# Patient Record
Sex: Male | Born: 1980 | Race: Black or African American | Hispanic: No | Marital: Married | Smoking: Never smoker
Health system: Southern US, Community
[De-identification: ages and names within clinical notes are randomized; demographics above are authoritative.]

---

## 2000-06-24 ENCOUNTER — Encounter: Payer: Self-pay | Admitting: Emergency Medicine

## 2000-06-24 ENCOUNTER — Emergency Department (HOSPITAL_COMMUNITY): Admission: EM | Admit: 2000-06-24 | Discharge: 2000-06-24 | Payer: Self-pay | Admitting: Emergency Medicine

## 2012-03-19 ENCOUNTER — Emergency Department (HOSPITAL_BASED_OUTPATIENT_CLINIC_OR_DEPARTMENT_OTHER)
Admission: EM | Admit: 2012-03-19 | Discharge: 2012-03-19 | Disposition: A | Discharge status: Home / Self Care | Payer: Managed Care, Other (non HMO) | Attending: Emergency Medicine | Admitting: Emergency Medicine

## 2012-03-19 ENCOUNTER — Encounter (HOSPITAL_BASED_OUTPATIENT_CLINIC_OR_DEPARTMENT_OTHER): Payer: Self-pay | Admitting: *Deleted

## 2012-03-19 DIAGNOSIS — Y92410 Unspecified street and highway as the place of occurrence of the external cause: Secondary | ICD-10-CM | POA: Insufficient documentation

## 2012-03-19 DIAGNOSIS — IMO0002 Reserved for concepts with insufficient information to code with codable children: Secondary | ICD-10-CM | POA: Insufficient documentation

## 2012-03-19 DIAGNOSIS — Y9389 Activity, other specified: Secondary | ICD-10-CM | POA: Insufficient documentation

## 2012-03-19 DIAGNOSIS — S40019A Contusion of unspecified shoulder, initial encounter: Secondary | ICD-10-CM | POA: Insufficient documentation
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
## 2013-01-03 ENCOUNTER — Emergency Department (HOSPITAL_BASED_OUTPATIENT_CLINIC_OR_DEPARTMENT_OTHER): Payer: Managed Care, Other (non HMO)

## 2013-01-03 ENCOUNTER — Emergency Department (HOSPITAL_BASED_OUTPATIENT_CLINIC_OR_DEPARTMENT_OTHER)
Admission: EM | Admit: 2013-01-03 | Discharge: 2013-01-03 | Disposition: A | Discharge status: Home / Self Care | Payer: Managed Care, Other (non HMO) | Attending: Emergency Medicine | Admitting: Emergency Medicine

## 2013-01-03 ENCOUNTER — Encounter (HOSPITAL_BASED_OUTPATIENT_CLINIC_OR_DEPARTMENT_OTHER): Payer: Self-pay | Admitting: Emergency Medicine

## 2013-01-03 DIAGNOSIS — J069 Acute upper respiratory infection, unspecified: Secondary | ICD-10-CM | POA: Insufficient documentation

## 2013-01-03 LAB — BASIC METABOLIC PANEL
CO2: 22 mEq/L (ref 19–32)
Calcium: 9.3 mg/dL (ref 8.4–10.5)
Creatinine, Ser: 1.2 mg/dL (ref 0.50–1.35)
GFR calc non Af Amer: 78 mL/min — ABNORMAL LOW (ref >90)
Glucose, Bld: 101 mg/dL — ABNORMAL HIGH (ref 70–99)
Sodium: 139 mEq/L (ref 135–145)

## 2013-01-03 LAB — CBC WITH DIFFERENTIAL/PLATELET
Basophils Absolute: 0 10*3/uL (ref 0.0–0.1)
Basophils Relative: 0 % (ref 0–1)
Eosinophils Absolute: 0.1 10*3/uL (ref 0.0–0.7)
Eosinophils Relative: 1 % (ref 0–5)
HCT: 42.5 % (ref 39.0–52.0)
Hemoglobin: 13.9 g/dL (ref 13.0–17.0)
Lymphocytes Relative: 8 % — ABNORMAL LOW (ref 12–46)
Lymphs Abs: 0.6 10*3/uL — ABNORMAL LOW (ref 0.7–4.0)
MCH: 25.7 pg — ABNORMAL LOW (ref 26.0–34.0)
MCHC: 32.7 g/dL (ref 30.0–36.0)
MCV: 78.6 fL (ref 78.0–100.0)
Monocytes Absolute: 1 10*3/uL (ref 0.1–1.0)
Monocytes Relative: 12 % (ref 3–12)
Neutro Abs: 6.6 10*3/uL (ref 1.7–7.7)
Neutrophils Relative %: 80 % — ABNORMAL HIGH (ref 43–77)
Platelets: 144 10*3/uL — ABNORMAL LOW (ref 150–400)
RBC: 5.41 MIL/uL (ref 4.22–5.81)
RDW: 14 % (ref 11.5–15.5)
WBC: 8.3 10*3/uL (ref 4.0–10.5)

## 2013-01-03 LAB — CG4 I-STAT (LACTIC ACID): Lactic Acid, Venous: 0.89 mmol/L (ref 0.5–2.2)

## 2013-01-03 MED ORDER — KETOROLAC TROMETHAMINE 30 MG/ML IJ SOLN
30.0000 mg | Freq: Once | INTRAMUSCULAR | Status: AC
  Administered 2013-01-03: 30 mg via INTRAVENOUS
  Filled 2013-01-03: qty 1

## 2013-01-03 MED ORDER — SODIUM CHLORIDE 0.9 % IV BOLUS (SEPSIS)
1000.0000 mL | Freq: Once | INTRAVENOUS | Status: AC
  Administered 2013-01-03: 1000 mL via INTRAVENOUS

## 2013-01-03 MED ORDER — PROMETHAZINE-CODEINE 6.25-10 MG/5ML PO SYRP: 5.0000 mL | ORAL_SOLUTION | Freq: Four times a day (QID) | ORAL | Status: AC | PRN

## 2013-01-03 MED ORDER — DEXAMETHASONE SODIUM PHOSPHATE 10 MG/ML IJ SOLN
10.0000 mg | Freq: Once | INTRAMUSCULAR | Status: AC
  Administered 2013-01-03: 10 mg via INTRAVENOUS
  Filled 2013-01-03: qty 1

## 2013-01-03 NOTE — ED Notes (Signed)
Patient transported to X-ray 

## 2013-01-03 NOTE — ED Provider Notes (Signed)
CSN: [id number]     Arrival date & time 01/03/13  1956 History   First MD Initiated Contact with Patient 01/03/13 2046     This chart was scribed for Gerhard Munch, MD by Ellin Mayhew, ED Scribe. This patient was seen in room MH12/MH12 and the patient's care was started at 9:27 PM.  Chief Complaint  Patient presents with  . Generalized Body Aches   The history is provided by the patient. No language interpreter was used.   HPI Comments: John Taylor is a 32 y.o. male who presents to the Emergency Department complaining of HA with associated pressure in his sinuses, congestion, dry cough, chills, and fever of 101 at home. Patient states his pain began during his shift at work last night. After work, his symptoms became worse with the development of fever with chills. Patient confirms taking tylenol with intermittent relief. His temperature is 100.2 degrees taken at the ED. He denies having a sore throat, nausea, emesis, diarrhea, rashes, and swelling. Patient has not had any sick contacts. He denies drinking or smoking and is not on any current medications.  History reviewed. No pertinent past medical history. History reviewed. No pertinent past surgical history. History reviewed. No pertinent family history. History  Substance Use Topics  . Smoking status: Never Smoker   . Smokeless tobacco: Not on file  . Alcohol Use: No    Review of Systems  A complete 10 system review of systems was obtained and all systems are negative except as noted in the HPI and PMH.   Allergies  Review of patient's allergies indicates no known allergies.  Home Medications   Current Outpatient Rx  Name  Route  Sig  Dispense  Refill  . acetaminophen (TYLENOL) 500 MG tablet   Oral   Take 500 mg by mouth every 6 (six) hours as needed.          Triage Vitals: BP 157/75  Pulse 103  Temp(Src) 100.2 F (37.9 C) (Oral)  Resp 16  Ht 6\' 2"  (1.88 m)  Wt 218 lb (98.884 kg)  BMI 27.98 kg/m2   SpO2 100%  Physical Exam  Nursing note and vitals reviewed. Constitutional: He is oriented to person, place, and time. He appears well-developed. No distress.  HENT:  Head: Normocephalic and atraumatic.  Lower pharynx with mild erythema. No injection. No exudate. Uvula is midline. TTP to the frontal sinuses.  Eyes: Conjunctivae and EOM are normal.  Neck:  R cervical adenopathy.   Cardiovascular: Normal rate, regular rhythm and normal heart sounds.   Pulmonary/Chest: Effort normal and breath sounds normal. No stridor. No respiratory distress.  Abdominal: He exhibits no distension.  Musculoskeletal: He exhibits no edema.  Lymphadenopathy:    He has cervical adenopathy.  Neurological: He is alert and oriented to person, place, and time.  Skin: Skin is warm and dry.  Psychiatric: He has a normal mood and affect.   ED Course  Procedures (including critical care time)  Medications  ketorolac (TORADOL) 30 MG/ML injection 30 mg (not administered)  sodium chloride 0.9 % bolus 1,000 mL (not administered)  dexamethasone (DECADRON) injection 10 mg (not administered)   DIAGNOSTIC STUDIES: Oxygen Saturation is 100% on room air, normal by my interpretation.    COORDINATION OF CARE: 9:01 PM-Ordered Toradol, Decadron, and fluids for patient. Treatment plan discussed with patient and patient agrees.  Labs Review Labs Reviewed - No data to display Imaging Review No results found.  EKG Interpretation   None  11:34 PM Patient states that he is feeling a "lot better". Results d/w him.  Return precautions as well.  MDM  No diagnosis found.  I personally performed the services described in this documentation, which was scribed in my presence. The recorded information has been reviewed and is accurate.   This previously well male presents with new cough, congestion, fever.  With the patient's youth, pneumonia was a consideration given the cough, fever.  Patient's evaluation here is  largely reassuring, and he improved substantially with IV fluids, analgesia.  With this improvement, he was discharged in stable condition to follow up with primary care.    Gerhard Munch, MD 01/03/13 [phone]

## 2013-01-03 NOTE — ED Notes (Signed)
Pt c/o body aches fever and sinus pressure with h/a x 1 day

## 2018-07-06 ENCOUNTER — Emergency Department (HOSPITAL_BASED_OUTPATIENT_CLINIC_OR_DEPARTMENT_OTHER)
Admission: EM | Admit: 2018-07-06 | Discharge: 2018-07-06 | Disposition: A | Discharge status: Home / Self Care | Payer: Managed Care, Other (non HMO) | Attending: Emergency Medicine | Admitting: Emergency Medicine

## 2018-07-06 ENCOUNTER — Encounter (HOSPITAL_BASED_OUTPATIENT_CLINIC_OR_DEPARTMENT_OTHER): Payer: Self-pay | Admitting: *Deleted

## 2018-07-06 ENCOUNTER — Emergency Department (HOSPITAL_BASED_OUTPATIENT_CLINIC_OR_DEPARTMENT_OTHER): Payer: Managed Care, Other (non HMO)

## 2018-07-06 ENCOUNTER — Other Ambulatory Visit: Payer: Self-pay

## 2018-07-06 DIAGNOSIS — M25562 Pain in left knee: Secondary | ICD-10-CM | POA: Diagnosis present

## 2018-07-06 DIAGNOSIS — M7652 Patellar tendinitis, left knee: Secondary | ICD-10-CM | POA: Insufficient documentation

## 2018-07-06 MED ORDER — NAPROXEN 500 MG PO TABS: 500.0000 mg | ORAL_TABLET | Freq: Two times a day (BID) | ORAL | 0 refills | Status: AC | PRN

## 2018-07-06 MED FILL — NAPROXEN 500 MG TABLET: 500 | 15 days supply | Qty: 30 | Fill #0

## 2018-07-06 NOTE — Discharge Instructions (Signed)
It was my pleasure taking care of you today!   Naproxen as needed for pain. You can also take Tylenol over-the-counter if needed for additional pain relief. Use crutches as needed for comfort. Ice and elevate knee throughout the day.  Follow up with the orthopedist listed if symptoms do not improve in one week.   Return to the ER for new or worsening symptoms, any additional concerns.

## 2018-07-06 NOTE — ED Notes (Signed)
Pt verbalized understanding of dc instructions.

## 2018-07-06 NOTE — ED Provider Notes (Signed)
[facility] Provider Note   CSN: [id number] Arrival date & time: 07/06/18  1442    History   Chief Complaint Chief Complaint  Patient presents with  . Knee Pain    HPI John Taylor is a 38 y.o. male.     The history is provided by the patient and medical records. No language interpreter was used.  Knee Pain  John Taylor is a 38 y.o. male  with no pertinent past medical history who presents to the Emergency Department complaining of left knee pain for the last 3 to 4 days.  Patient denies any known injury or inciting event.  He states that during the day, he was dancing while holding his 7-year-old.  He does this routinely.  He never had any episode of pain to the knee during this.  That night, when he went to sleep, he tried to extend his knee out and felt a sharp pain to the anterior center of his knee.  He noticed pain anytime he would bend or extend the knee and this has been persistent for the last several days.  Pain is worse with ambulation.  He has been walking with a limp.  He denies any numbness.  No pain to the ankle, hip or back.  He has tried wearing a knee brace as well as applying ice with minimal improvement.  No history of similar.   History reviewed. No pertinent past medical history.  There are no active problems to display for this patient.   History reviewed. No pertinent surgical history.      Home Medications    Prior to Admission medications   Medication Sig Start Date End Date Taking? Authorizing Provider  acetaminophen (TYLENOL) 500 MG tablet Take 500 mg by mouth every 6 (six) hours as needed.    [provider]  naproxen (NAPROSYN) 500 MG tablet Take 1 tablet (500 mg total) by mouth 2 (two) times daily as needed for mild pain or moderate pain. 07/06/18   Mayana Irigoyen, Ozella Almond, PA-C  promethazine-codeine (PHENERGAN WITH CODEINE) 6.25-10 MG/5ML syrup Take 5 mLs by mouth every 6 (six) hours  as needed (cough, nausea). 01/03/13   Carmin Muskrat, MD    Family History No family history on file.  Social History Social History   Tobacco Use  . Smoking status: Never Smoker  Substance Use Topics  . Alcohol use: No    Frequency: Never  . Drug use: Not on file     Allergies   Patient has no known allergies.   Review of Systems Review of Systems  Musculoskeletal: Positive for arthralgias and myalgias.  Skin: Negative for color change and wound.  Neurological: Negative for weakness and numbness.     Physical Exam Updated Vital Signs BP (!) 144/79   Pulse 79   Temp 98.1 F (36.7 C)   Resp 17   Wt 111.4 kg   SpO2 100%   BMI 31.52 kg/m   Physical Exam Vitals signs and nursing note reviewed.  Constitutional:      General: He is not in acute distress.    Appearance: He is well-developed.  HENT:     Head: Normocephalic and atraumatic.  Neck:     Musculoskeletal: Neck supple.  Cardiovascular:     Rate and Rhythm: Normal rate and regular rhythm.     Heart sounds: Normal heart sounds. No murmur.  Pulmonary:     Effort: Pulmonary effort is normal. No respiratory distress.  Breath sounds: Normal breath sounds. No wheezing or rales.  Musculoskeletal:     Comments: Left knee with tenderness to palpation over the patellar tendon. + Clark's test. No joint line tenderness. No joint effusion or swelling appreciated.  He has slightly decreased range of motion likely secondary to pain, but can flex and extend dependently. There is no abnormal alignment or patellar mobility. No varus/valgus laxity. Negative drawer's, Lachman's and McMurray's.  No crepitus. 2+ DP pulses bilaterally. All compartments are soft. Sensation intact distal to injury.  Skin:    General: Skin is warm and dry.  Neurological:     Mental Status: He is alert.      ED Treatments / Results  Labs (all labs ordered are listed, but only abnormal results are displayed) Labs Reviewed - No data to  display  EKG None  Radiology Dg Knee Complete 4 Views Left  Result Date: 07/06/2018 CLINICAL DATA:  Left knee pain. EXAM: LEFT KNEE - COMPLETE 4+ VIEW COMPARISON:  None. FINDINGS: No evidence of fracture, dislocation, or joint effusion. No evidence of arthropathy or other focal bone abnormality. Soft tissues are unremarkable. IMPRESSION: Negative. Electronically Signed   By: Lupita RaiderJames  Green Jr M.D.   On: 07/06/2018 15:28    Procedures Procedures (including critical care time)  Medications Ordered in ED Medications - No data to display   Initial Impression / Assessment and Plan / ED Course  I have reviewed the triage vital signs and the nursing notes.  Pertinent labs & imaging results that were available during my care of the patient were reviewed by me and considered in my medical decision making (see chart for details).       John Taylor is a 38 y.o. male who presents to ED for knee pain for the last 3 to 4 days.  On exam, patient lle nvi with tenderness along the patellar tendon as well as + Clark's test.  Clinically, concern for patellar tendinitis.  Will obtain x-ray to further evaluate, but anticipate discharge to home with rice and NSAID treatment and Ortho follow-up if no improvement.  X-ray reviewed and negative.  Symptomatic home care instructions, Ortho follow-up and return precautions.  All questions answered.   Final Clinical Impressions(s) / ED Diagnoses   Final diagnoses:  Patellar tendinitis of left knee    ED Discharge Orders         Ordered    naproxen (NAPROSYN) 500 MG tablet  2 times daily PRN     07/06/18 1531           Colbi Staubs, Chase PicketJaime Pilcher, PA-C 07/06/18 1533    Blane OharaZavitz, Joshua, MD 07/06/18 2320

## 2018-07-06 NOTE — ED Triage Notes (Signed)
Pt.  Reports he felt a pain in the L knee on Monday morning without injury.  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  Pt. Feels most pain in the Patella tendon area per pt.

## 2020-09-08 IMAGING — CR LEFT KNEE - COMPLETE 4+ VIEW
4 series · 4 of 4 positions shown · non-contrast
Comparison: None.

CLINICAL DATA: Left knee pain.

EXAM:
LEFT KNEE - COMPLETE 4+ VIEW

[t knee ap left]
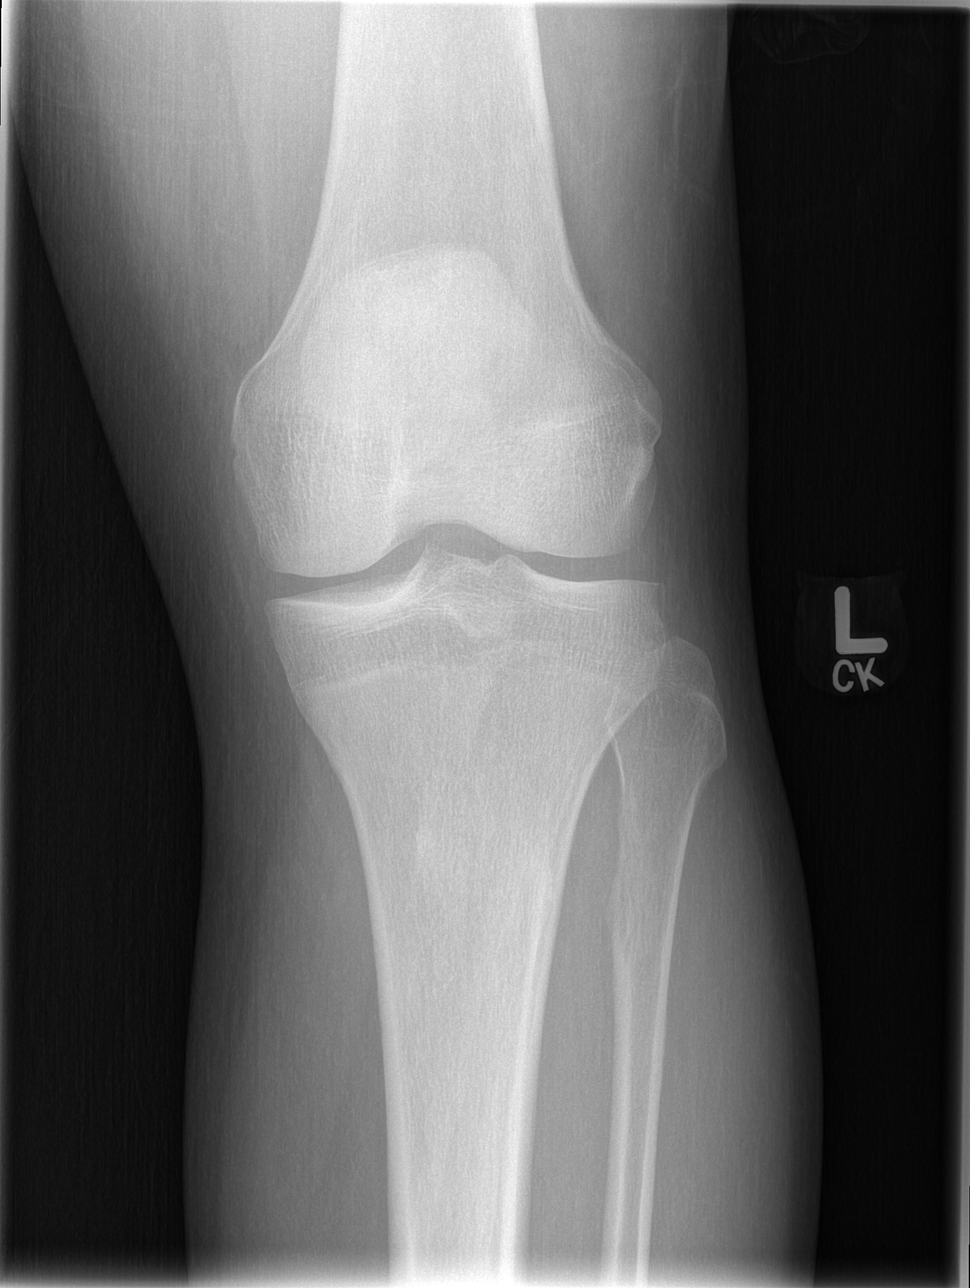

[t knee oblique left (1 of 2)]
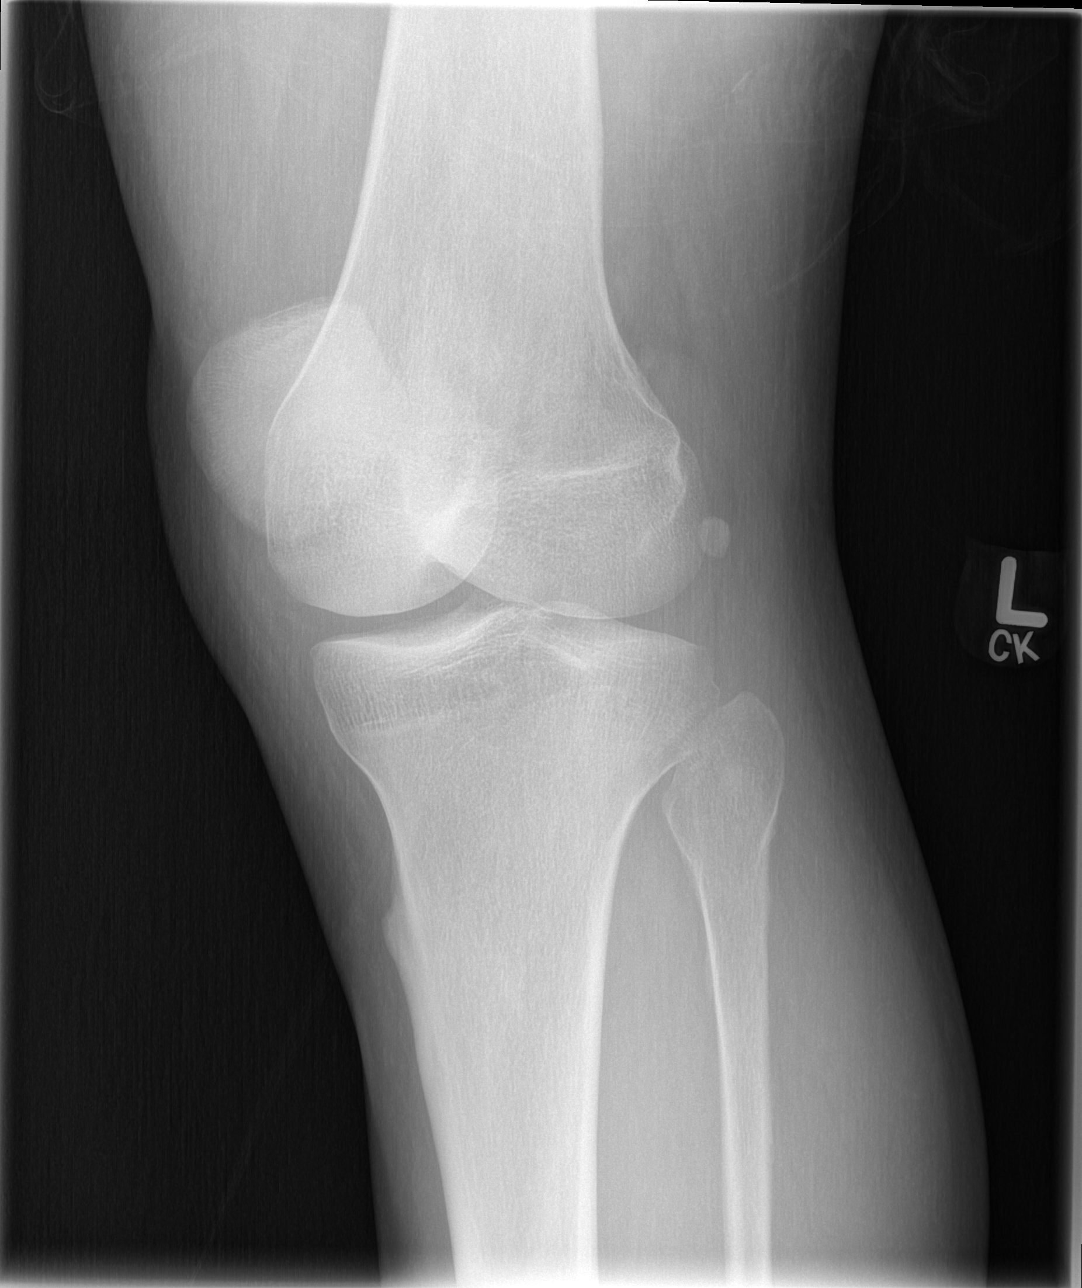

[t knee oblique left (2 of 2)]
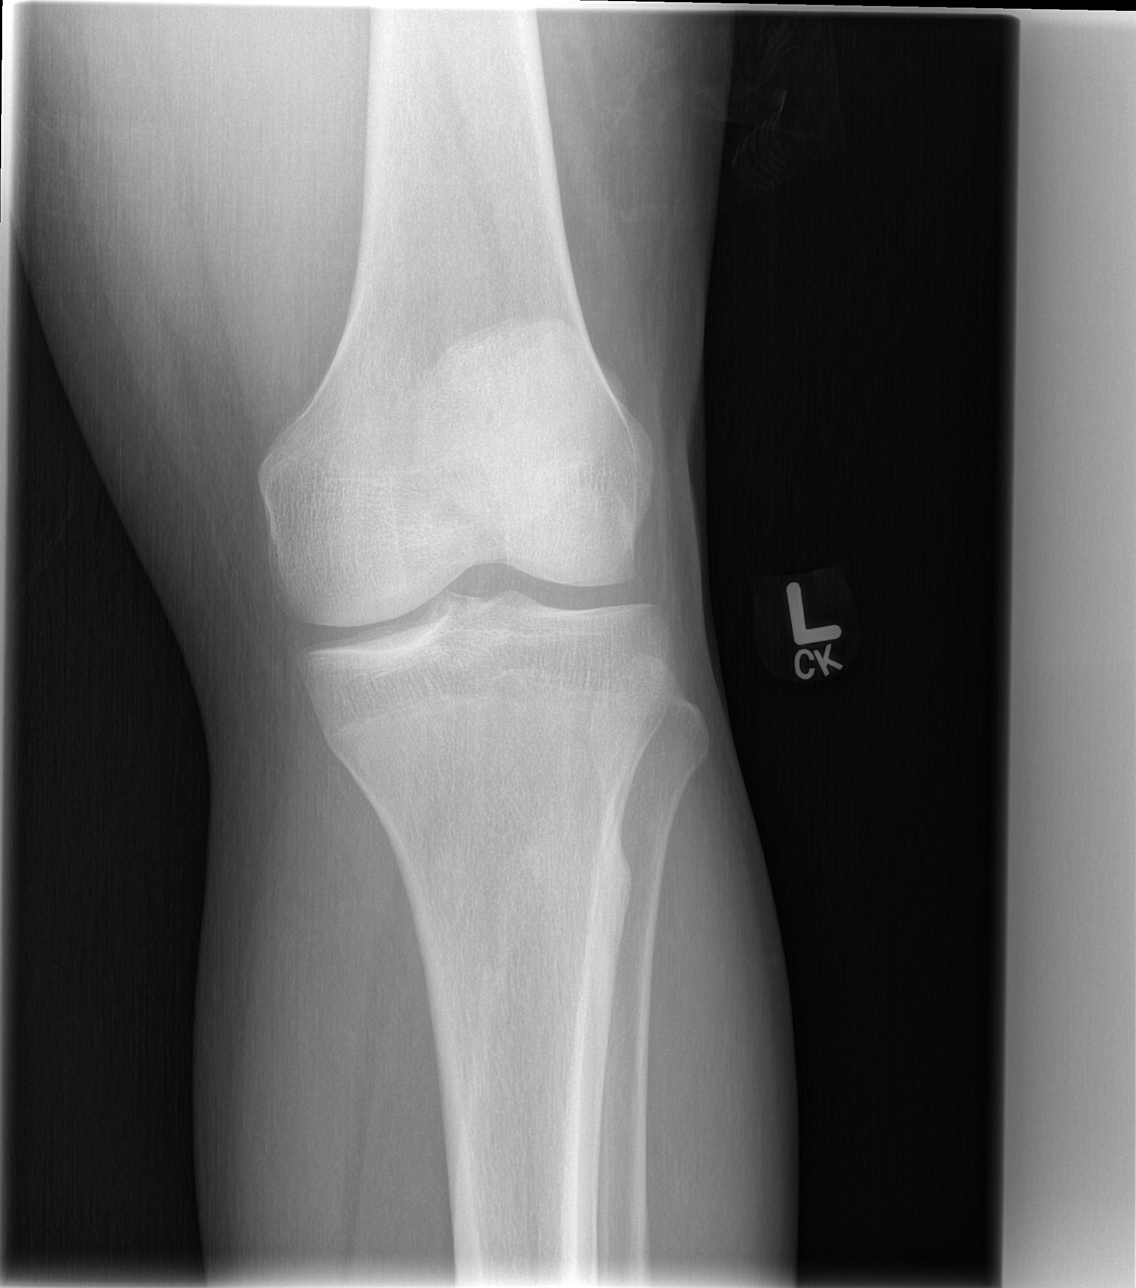

[t knee lat left]
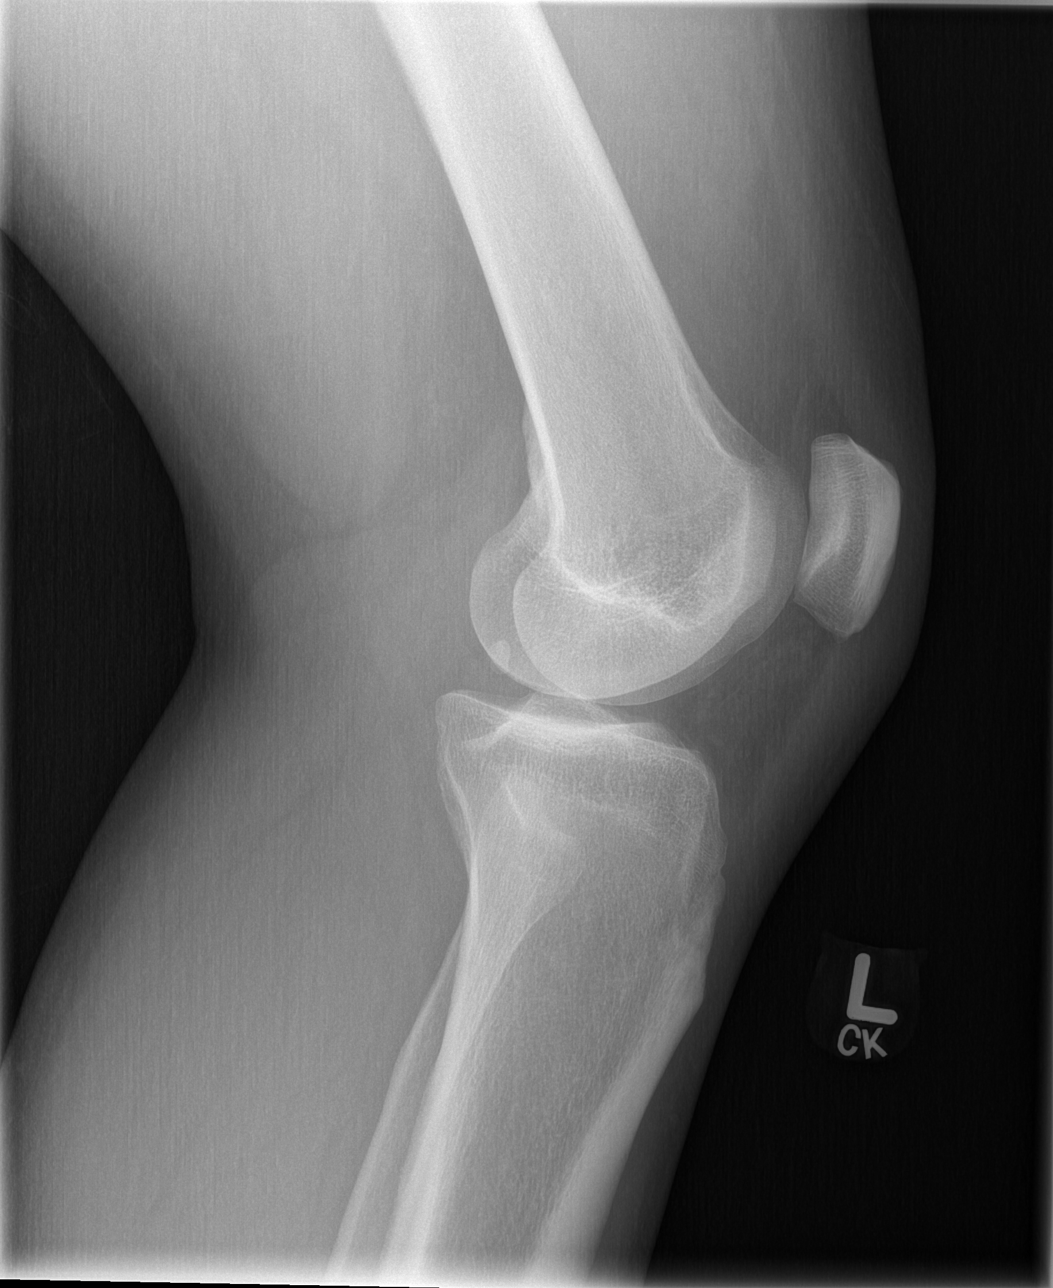

[4 of 4 positions shown; findings below may reference images not displayed]

FINDINGS: No evidence of fracture, dislocation, or joint effusion. No evidence
of arthropathy or other focal bone abnormality. Soft tissues are
unremarkable.
IMPRESSION: Negative.
